# Patient Record
Sex: Male | Born: 1985 | Race: Black or African American | Hispanic: No | Marital: Married | Smoking: Current every day smoker
Health system: Southern US, Community
[De-identification: ages and names within clinical notes are randomized; demographics above are authoritative.]

## PROBLEM LIST (undated history)

## (undated) DIAGNOSIS — M549 Dorsalgia, unspecified: Secondary | ICD-10-CM

## (undated) HISTORY — PX: OTHER SURGICAL HISTORY: SHX169

## (undated) HISTORY — DX: Dorsalgia, unspecified: M54.9

---
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
## 2010-09-18 ENCOUNTER — Emergency Department (HOSPITAL_COMMUNITY)
Admission: EM | Admit: 2010-09-18 | Discharge: 2010-09-18 | Payer: Self-pay | Source: Home / Self Care | Admitting: Family Medicine

## 2010-12-03 LAB — GC/CHLAMYDIA PROBE AMP, GENITAL: Chlamydia, DNA Probe: NEGATIVE

## 2013-07-07 ENCOUNTER — Emergency Department (HOSPITAL_COMMUNITY)
Admission: EM | Admit: 2013-07-07 | Discharge: 2013-07-07 | Disposition: A | Discharge status: Home / Self Care | Payer: Self-pay | Attending: Emergency Medicine | Admitting: Emergency Medicine

## 2013-07-07 ENCOUNTER — Encounter (HOSPITAL_COMMUNITY): Payer: Self-pay | Admitting: Emergency Medicine

## 2013-07-07 DIAGNOSIS — K044 Acute apical periodontitis of pulpal origin: Secondary | ICD-10-CM | POA: Insufficient documentation

## 2013-07-07 DIAGNOSIS — R509 Fever, unspecified: Secondary | ICD-10-CM | POA: Insufficient documentation

## 2013-07-07 DIAGNOSIS — K047 Periapical abscess without sinus: Secondary | ICD-10-CM

## 2013-07-07 DIAGNOSIS — F17200 Nicotine dependence, unspecified, uncomplicated: Secondary | ICD-10-CM | POA: Insufficient documentation

## 2013-07-07 DIAGNOSIS — R51 Headache: Secondary | ICD-10-CM | POA: Insufficient documentation

## 2013-07-07 DIAGNOSIS — K0889 Other specified disorders of teeth and supporting structures: Secondary | ICD-10-CM

## 2013-07-07 MED ORDER — AMOXICILLIN 500 MG PO CAPS: 500.0000 mg | ORAL_CAPSULE | Freq: Three times a day (TID) | ORAL | Status: DC

## 2013-07-07 MED ORDER — HYDROCODONE-ACETAMINOPHEN 5-325 MG PO TABS: 1.0000 | ORAL_TABLET | ORAL | Status: DC | PRN

## 2013-07-07 NOTE — ED Notes (Signed)
Pt reports gradual onset on Left upper and lower wisdom teeth pain x3 days

## 2013-07-07 NOTE — ED Provider Notes (Signed)
CSN: [id number]     Arrival date & time 07/07/13  1143 History  This chart was scribed for Johnnette Gourd, PA, working with Donnetta Hutching, MD by Blanchard Kelch, ED Scribe. This patient was seen in room TR05C/TR05C and the patient's care was started at 12:01 PM.    Chief Complaint  Patient presents with  . Dental Pain    The history is provided by the patient. No language interpreter was used.    HPI Comments: Anthony Peck is a 27 y.o. male who presents to the Emergency Department complaining of upper and lower left dental pain that started three days ago. The pain radiates throughout the left side of his head. It is worsened by chewing. He describes the pain as a 9/10 in severity. He took ibuprofen for the pain without relief. He also complains of headache, fever and chills from the pain.   History reviewed. No pertinent past medical history. History reviewed. No pertinent past surgical history. History reviewed. No pertinent family history. History  Substance Use Topics  . Smoking status: Current Every Day Smoker  . Smokeless tobacco: Not on file  . Alcohol Use: Yes    Review of Systems A complete 10 system review of systems was obtained and all systems are negative except as noted in the HPI and PMH.    Allergies  Review of patient's allergies indicates no known allergies.  Home Medications  No current outpatient prescriptions on file.  Triage Vitals: BP 134/79  Pulse 75  Temp(Src) 98.7 F (37.1 C) (Oral)  Resp 16  Wt 184 lb 14.4 oz (83.87 kg)  SpO2 100%  Physical Exam  Nursing note and vitals reviewed. Constitutional: He is oriented to person, place, and time. He appears well-developed and well-nourished. No distress.  HENT:  Head: Normocephalic and atraumatic.  Mouth/Throat: Uvula is midline and oropharynx is clear and moist.    Left upper posterior molar with surrounding erythema and edema, no abscess.  Eyes: Conjunctivae and EOM are normal.  Neck: Normal range  of motion. Neck supple.  Cardiovascular: Normal rate, regular rhythm and normal heart sounds.   Pulmonary/Chest: Effort normal and breath sounds normal.  Musculoskeletal: Normal range of motion. He exhibits no edema.  Lymphadenopathy:       Head (left side): No submandibular adenopathy present.  Neurological: He is alert and oriented to person, place, and time.  Skin: Skin is warm and dry.  Psychiatric: He has a normal mood and affect. His behavior is normal.    ED Course  Procedures (including critical care time)  DIAGNOSTIC STUDIES: Oxygen Saturation is 100% on room air, normal by my interpretation.    COORDINATION OF CARE: 12:08 PM -Will order antibiotics and pain medication. Recommend follow up with dentist for infection. Patient verbalizes understanding and agrees with treatment plan.   Labs Review Labs Reviewed - No data to display Imaging Review No results found.  EKG Interpretation   None       MDM   1. Pain, dental   2. Dental infection     Dental pain associated with dental infection. No evidence of dental abscess. Patient is afebrile, non toxic appearing and swallowing secretions well. I gave patient referral to dentist and stressed the importance of dental follow up for ultimate management of dental pain. I will also give amoxicillin and pain control. Patient voices understanding and is agreeable to plan.   I personally performed the services described in this documentation, which was scribed in my presence. The  recorded information has been reviewed and is accurate.    Trevor Mace, PA-C 07/07/13 1218

## 2013-07-10 NOTE — ED Provider Notes (Signed)
Medical screening examination/treatment/procedure(s) were performed by non-physician practitioner and as supervising physician I was immediately available for consultation/collaboration.  Jordin Vicencio, MD 07/10/13 1550 

## 2013-08-13 ENCOUNTER — Emergency Department (HOSPITAL_COMMUNITY)
Admission: EM | Admit: 2013-08-13 | Discharge: 2013-08-13 | Disposition: A | Discharge status: Home / Self Care | Payer: Self-pay | Attending: Emergency Medicine | Admitting: Emergency Medicine

## 2013-08-13 ENCOUNTER — Emergency Department (HOSPITAL_COMMUNITY): Payer: Self-pay

## 2013-08-13 ENCOUNTER — Encounter (HOSPITAL_COMMUNITY): Payer: Self-pay | Admitting: Emergency Medicine

## 2013-08-13 DIAGNOSIS — Z23 Encounter for immunization: Secondary | ICD-10-CM | POA: Insufficient documentation

## 2013-08-13 DIAGNOSIS — S60221A Contusion of right hand, initial encounter: Secondary | ICD-10-CM

## 2013-08-13 DIAGNOSIS — W208XXA Other cause of strike by thrown, projected or falling object, initial encounter: Secondary | ICD-10-CM | POA: Insufficient documentation

## 2013-08-13 DIAGNOSIS — Y9389 Activity, other specified: Secondary | ICD-10-CM | POA: Insufficient documentation

## 2013-08-13 DIAGNOSIS — S6991XA Unspecified injury of right wrist, hand and finger(s), initial encounter: Secondary | ICD-10-CM

## 2013-08-13 DIAGNOSIS — S6990XA Unspecified injury of unspecified wrist, hand and finger(s), initial encounter: Secondary | ICD-10-CM | POA: Insufficient documentation

## 2013-08-13 DIAGNOSIS — S60229A Contusion of unspecified hand, initial encounter: Secondary | ICD-10-CM | POA: Insufficient documentation

## 2013-08-13 DIAGNOSIS — F17200 Nicotine dependence, unspecified, uncomplicated: Secondary | ICD-10-CM | POA: Insufficient documentation

## 2013-08-13 DIAGNOSIS — Y929 Unspecified place or not applicable: Secondary | ICD-10-CM | POA: Insufficient documentation

## 2013-08-13 MED ORDER — HYDROCODONE-ACETAMINOPHEN 5-325 MG PO TABS
2.0000 | ORAL_TABLET | Freq: Once | ORAL | Status: AC
  Administered 2013-08-13: 2 via ORAL
  Filled 2013-08-13: qty 2

## 2013-08-13 MED ORDER — TETANUS-DIPHTH-ACELL PERTUSSIS 5-2.5-18.5 LF-MCG/0.5 IM SUSP
0.5000 mL | Freq: Once | INTRAMUSCULAR | Status: AC
  Administered 2013-08-13: 0.5 mL via INTRAMUSCULAR
  Filled 2013-08-13: qty 0.5

## 2013-08-13 MED ORDER — IBUPROFEN 200 MG PO TABS
600.0000 mg | ORAL_TABLET | Freq: Once | ORAL | Status: AC
  Administered 2013-08-13: 600 mg via ORAL
  Filled 2013-08-13: qty 3

## 2013-08-13 NOTE — ED Provider Notes (Signed)
CSN: [id number]     Arrival date & time 08/13/13  1418 History  This chart was scribed for non-physician practitioner, Maryann Conners, working with Bonnita Levan. Bernette Mayers, MD by Shari Heritage, ED Scribe. This patient was seen in room TR08C/TR08C and the patient's care was started at 3:50 PM.    Chief Complaint  Patient presents with  . Hand Injury    The history is provided by the patient. No language interpreter was used.    HPI Comments: Anthony Peck is a 27 y.o. male who presents to the Emergency Department complaining of constant right hand pain onset several hours ago. Patient states that he was moving furniture when a dresser was dropped onto his right hand. Currently he rates pain as 10/10. There is associated swelling and abrasions to the right hand. He denies any other injuries at this time. He has no chronic medical conditions. He is a current every day smoker.   History reviewed. No pertinent past medical history. History reviewed. No pertinent past surgical history. No family history on file. History  Substance Use Topics  . Smoking status: Current Every Day Smoker -- 0.50 packs/day    Types: Cigarettes  . Smokeless tobacco: Not on file  . Alcohol Use: Yes    Review of Systems A complete 10 system review of systems was obtained and all systems are negative except as noted in the HPI and PMH.   Allergies  Review of patient's allergies indicates no known allergies.  Home Medications  No current outpatient prescriptions on file. Triage Vitals: BP 143/88  Pulse 103  Temp(Src) 98.9 F (37.2 C) (Oral)  Resp 16  Ht 6\' 3"  (1.905 m)  Wt 186 lb (84.369 kg)  BMI 23.25 kg/m2  SpO2 99% Physical Exam  Nursing note and vitals reviewed. Constitutional: He is oriented to person, place, and time. He appears well-developed and well-nourished. No distress.  HENT:  Head: Normocephalic and atraumatic.  Eyes: EOM are normal.  Neck: Neck supple.  Cardiovascular: Normal rate.    Musculoskeletal: Normal range of motion.  Tender to palpation over the 1st and 2nd MCPs with swelling. Range of motion of right hand limited by pain. Normal right wrist. 2+ radial pulses. Capillary refill less than 3 seconds.   Neurological: He is alert and oriented to person, place, and time.  Skin: Skin is warm and dry.  Over 1st and 2nd MCP joints there are three 1 mm superficial puncture wounds. No active bleeding.  Psychiatric: He has a normal mood and affect. His behavior is normal.    ED Course  Procedures (including critical care time) DIAGNOSTIC STUDIES: Oxygen Saturation is 99% on room air, normal by my interpretation.    COORDINATION OF CARE: 3:55 PM- Patient informed of current plan for treatment and evaluation and agrees with plan at this time.    Imaging Review Dg Hand Complete Right  08/13/2013   CLINICAL DATA:  Reported history of trauma with limited range of motion and pain  EXAM: RIGHT HAND - COMPLETE 3+ VIEW  COMPARISON:  None.  FINDINGS: There is no evidence of fracture or dislocation. There is no evidence of arthropathy or other focal bone abnormality. Soft tissues are unremarkable.  IMPRESSION: Negative.   Electronically Signed   By: Salome Holmes M.D.   On: 08/13/2013 15:06     MDM   1. Hand injury, right, initial encounter   2. Hand contusion, right, initial encounter    No acute findings on xray. Tdap  given. ACE wrap, ice, elevation, NSAIDs. Return precautions given. Neurovascularly intact. Patient states understanding of treatment care plan and is agreeable.   I personally performed the services described in this documentation, which was scribed in my presence. The recorded information has been reviewed and is accurate.   Trevor Mace, PA-C 08/13/13 [phone]

## 2013-08-13 NOTE — ED Notes (Signed)
Pt was moving furniture and a dresser dropped on his R hand.  R hand with abrasions and swollen.  C/o some numbness to 3rd and 4th fingers.

## 2013-08-13 NOTE — ED Notes (Signed)
PT ambulated with baseline gait; VSS; A&Ox3; no signs of distress; respirations even and unlabored; skin warm and dry; no questions upon discharge.  

## 2013-08-13 NOTE — ED Provider Notes (Signed)
Medical screening examination/treatment/procedure(s) were performed by non-physician practitioner and as supervising physician I was immediately available for consultation/collaboration.  EKG Interpretation   None         Jozey Janco B. Levii Hairfield, MD 08/13/13 1922 

## 2014-12-03 ENCOUNTER — Emergency Department (HOSPITAL_COMMUNITY)
Admission: EM | Admit: 2014-12-03 | Discharge: 2014-12-03 | Discharge status: Against Medical Advice | Payer: Self-pay | Attending: Emergency Medicine | Admitting: Emergency Medicine

## 2014-12-03 ENCOUNTER — Encounter (HOSPITAL_COMMUNITY): Payer: Self-pay | Admitting: Emergency Medicine

## 2014-12-03 DIAGNOSIS — Z720 Tobacco use: Secondary | ICD-10-CM | POA: Insufficient documentation

## 2014-12-03 DIAGNOSIS — Y998 Other external cause status: Secondary | ICD-10-CM | POA: Insufficient documentation

## 2014-12-03 DIAGNOSIS — S0990XA Unspecified injury of head, initial encounter: Secondary | ICD-10-CM | POA: Insufficient documentation

## 2014-12-03 DIAGNOSIS — W51XXXA Accidental striking against or bumped into by another person, initial encounter: Secondary | ICD-10-CM | POA: Insufficient documentation

## 2014-12-03 DIAGNOSIS — Y9289 Other specified places as the place of occurrence of the external cause: Secondary | ICD-10-CM | POA: Insufficient documentation

## 2014-12-03 DIAGNOSIS — Y9389 Activity, other specified: Secondary | ICD-10-CM | POA: Insufficient documentation

## 2014-12-03 NOTE — ED Notes (Signed)
No answer in WR x1

## 2014-12-03 NOTE — ED Notes (Signed)
Pt not present at this time. Will Remove from system

## 2014-12-03 NOTE — ED Notes (Signed)
Hematoma on L forehead, small abrasion noted to center.
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
## 2014-12-07 ENCOUNTER — Encounter (HOSPITAL_COMMUNITY): Payer: Self-pay | Admitting: *Deleted

## 2014-12-07 ENCOUNTER — Emergency Department (HOSPITAL_COMMUNITY)
Admission: EM | Admit: 2014-12-07 | Discharge: 2014-12-07 | Disposition: A | Discharge status: Home / Self Care | Payer: Self-pay | Attending: Emergency Medicine | Admitting: Emergency Medicine

## 2014-12-07 ENCOUNTER — Emergency Department (HOSPITAL_COMMUNITY): Payer: Self-pay

## 2014-12-07 DIAGNOSIS — Y9389 Activity, other specified: Secondary | ICD-10-CM | POA: Insufficient documentation

## 2014-12-07 DIAGNOSIS — Y9289 Other specified places as the place of occurrence of the external cause: Secondary | ICD-10-CM | POA: Insufficient documentation

## 2014-12-07 DIAGNOSIS — S0990XA Unspecified injury of head, initial encounter: Secondary | ICD-10-CM

## 2014-12-07 DIAGNOSIS — Y288XXA Contact with other sharp object, undetermined intent, initial encounter: Secondary | ICD-10-CM | POA: Insufficient documentation

## 2014-12-07 DIAGNOSIS — S0181XA Laceration without foreign body of other part of head, initial encounter: Secondary | ICD-10-CM | POA: Insufficient documentation

## 2014-12-07 DIAGNOSIS — Z720 Tobacco use: Secondary | ICD-10-CM | POA: Insufficient documentation

## 2014-12-07 DIAGNOSIS — Y998 Other external cause status: Secondary | ICD-10-CM | POA: Insufficient documentation
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
## 2014-12-07 NOTE — Discharge Instructions (Signed)
Take tylenol, motrin for pain.   Follow up with your doctor.   Return to ER if you have severe headache, vomiting, double vision.
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
## 2017-02-26 ENCOUNTER — Emergency Department (HOSPITAL_COMMUNITY)
Admission: EM | Admit: 2017-02-26 | Discharge: 2017-02-27 | Disposition: A | Discharge status: Home / Self Care | Payer: 59 | Attending: Emergency Medicine | Admitting: Emergency Medicine

## 2017-02-26 ENCOUNTER — Emergency Department (HOSPITAL_COMMUNITY): Payer: 59

## 2017-02-26 ENCOUNTER — Encounter (HOSPITAL_COMMUNITY): Payer: Self-pay | Admitting: Nurse Practitioner

## 2017-02-26 DIAGNOSIS — R29898 Other symptoms and signs involving the musculoskeletal system: Secondary | ICD-10-CM

## 2017-02-26 DIAGNOSIS — M549 Dorsalgia, unspecified: Secondary | ICD-10-CM | POA: Diagnosis present

## 2017-02-26 DIAGNOSIS — M546 Pain in thoracic spine: Secondary | ICD-10-CM

## 2017-02-26 DIAGNOSIS — F17210 Nicotine dependence, cigarettes, uncomplicated: Secondary | ICD-10-CM | POA: Insufficient documentation

## 2017-02-26 DIAGNOSIS — R202 Paresthesia of skin: Secondary | ICD-10-CM

## 2017-02-26 DIAGNOSIS — M62830 Muscle spasm of back: Secondary | ICD-10-CM | POA: Diagnosis not present

## 2017-02-26 DIAGNOSIS — R200 Anesthesia of skin: Secondary | ICD-10-CM

## 2017-02-26 LAB — COMPREHENSIVE METABOLIC PANEL
ALBUMIN: 4.1 g/dL (ref 3.5–5.0)
ALK PHOS: 43 U/L (ref 38–126)
ALT: 34 U/L (ref 17–63)
ANION GAP: 9 (ref 5–15)
AST: 26 U/L (ref 15–41)
BUN: 19 mg/dL (ref 6–20)
CALCIUM: 9.1 mg/dL (ref 8.9–10.3)
CO2: 31 mmol/L (ref 22–32)
Chloride: 101 mmol/L (ref 101–111)
Creatinine, Ser: 1.28 mg/dL — ABNORMAL HIGH (ref 0.61–1.24)
GFR calc Af Amer: >60 mL/min (ref >60)
Glucose, Bld: 97 mg/dL (ref 65–99)
POTASSIUM: 4.7 mmol/L (ref 3.5–5.1)
SODIUM: 141 mmol/L (ref 135–145)
TOTAL PROTEIN: 6.2 g/dL — AB (ref 6.5–8.1)
Total Bilirubin: 1.2 mg/dL (ref 0.3–1.2)

## 2017-02-26 LAB — CBC WITH DIFFERENTIAL/PLATELET
BASOS ABS: 0 10*3/uL (ref 0.0–0.1)
Basophils Relative: 0 %
EOS ABS: 0.1 10*3/uL (ref 0.0–0.7)
EOS PCT: 1 %
HCT: 45.8 % (ref 39.0–52.0)
HEMOGLOBIN: 15.3 g/dL (ref 13.0–17.0)
Lymphocytes Relative: 51 %
Lymphs Abs: 5.4 10*3/uL — ABNORMAL HIGH (ref 0.7–4.0)
MCH: 29.7 pg (ref 26.0–34.0)
MCHC: 33.4 g/dL (ref 30.0–36.0)
MCV: 88.9 fL (ref 78.0–100.0)
Monocytes Absolute: 0.7 10*3/uL (ref 0.1–1.0)
Monocytes Relative: 7 %
NEUTROS PCT: 41 %
Neutro Abs: 4.2 10*3/uL (ref 1.7–7.7)
PLATELETS: 262 10*3/uL (ref 150–400)
RBC: 5.15 MIL/uL (ref 4.22–5.81)
RDW: 12.8 % (ref 11.5–15.5)
WBC: 10.5 10*3/uL (ref 4.0–10.5)

## 2017-02-26 NOTE — ED Notes (Signed)
Provider at the bedside.  

## 2017-02-26 NOTE — ED Provider Notes (Signed)
[facility]-EMERGENCY DEPT Provider Note   CSN: [id number] Arrival date & time: 02/26/17  1702  By signing my name below, I, Rosana Fret, attest that this documentation has been prepared under the direction and in the presence of non-physician practitioner, Kerrie Buffalo, FNP. Electronically Signed: Rosana Fret, ED Scribe. 02/26/17. 6:56 PM.  History   Chief Complaint Chief Complaint  Patient presents with  . Back Pain   The history is provided by the patient. No language interpreter was used.   HPI Comments: Anthony Peck is a 31 y.o. male who presents to the Emergency Department complaining of constant, moderate back pain onset 1 week ago. Pt describes pain as radiating from his neck to his lower back. Pt has a hx of a back injury. 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 Pt regularly works out and reports that he lifted 275 lbs 2 weeks ago.  Pt reports associated constipation and bloated feeling. Pt states his neck pain is exacerbated by looking upward and his back pain is exacerbated by twisting. Pt has tried Tramadol, cyclobenzaprine, and dexamethasone with minimal relief of his symptoms. Pt denies weakness, urinary frequency, dysuria or any other complaints at this time.  History reviewed. No pertinent past medical history.  There are no active problems to display for this patient.   History reviewed. No pertinent surgical history.     Home Medications    Prior to Admission medications   Not on File    Family History History reviewed. No pertinent family history.  Social History Social History  Substance Use Topics  . Smoking status: Current Every Day Smoker    Packs/day: 0.50    Types: Cigarettes  . Smokeless tobacco: Never Used  . Alcohol use Yes     Comment: weekly     Allergies   Patient has no known allergies.   Review of Systems Review of Systems  Gastrointestinal: Positive for constipation.  Genitourinary:  Negative for dysuria and frequency.  Musculoskeletal: Positive for back pain, myalgias and neck pain.  Neurological: Positive for numbness. Negative for weakness.     Physical Exam Updated Vital Signs BP (!) 143/82 (BP Location: Left Arm)   Pulse 78   Temp 98.9 F (37.2 C) (Oral)   Resp (!) 22   SpO2 99%   Physical Exam  Constitutional: He appears well-developed and well-nourished. No distress.  HENT:  Head: Normocephalic and atraumatic.  Eyes: EOM are normal.  Neck: Normal range of motion. Neck supple.  Cardiovascular: Normal rate and regular rhythm.   Pulmonary/Chest: Effort normal and breath sounds normal.  Abdominal: Soft. Bowel sounds are normal. There is tenderness (mild epigastric). There is no rebound and no guarding.  Musculoskeletal: He exhibits tenderness. He exhibits no edema.       Cervical back: He exhibits tenderness and pain. He exhibits normal range of motion, no deformity and no spasm.       Thoracic back: He exhibits tenderness, pain and spasm. He exhibits normal range of motion and no deformity.  Tenderness over the cervical spine and thoracic spine. Muscle spasm of the thoracic area bilaterally.   Neurological: He is alert. He has normal strength. Gait normal.  Reflex Scores:      Bicep reflexes are 1+ on the right side and 1+ on the left side.      Brachioradialis reflexes are 1+ on the right side and 1+ on the left side.      Patellar  reflexes are 1+ on the right side and 1+ on the left side. Skin: Skin is warm and dry.  Psychiatric: He has a normal mood and affect. His behavior is normal.  Nursing note and vitals reviewed.    ED Treatments / Results  DIAGNOSTIC STUDIES: Oxygen Saturation is 99% on RA, normal by my interpretation.   COORDINATION OF CARE: 6:51 PM-Discussed next steps with pt including imaging of the back and neck. Pt verbalized understanding and is agreeable with the plan.   Labs (all labs ordered are listed, but only abnormal  results are displayed) Labs Reviewed - No data to display   Radiology Ct Cervical Spine Wo Contrast  Result Date: 02/26/2017 CLINICAL DATA:  Neck pain for 1 week. EXAM: CT CERVICAL SPINE WITHOUT CONTRAST TECHNIQUE: Multidetector CT imaging of the cervical spine was performed without intravenous contrast. Multiplanar CT image reconstructions were also generated. COMPARISON:  None. FINDINGS: Alignment: Normal. Skull base and vertebrae: No acute fracture. No primary bone lesion or focal pathologic process. Soft tissues and spinal canal: No prevertebral fluid or swelling. No visible canal hematoma. Disc levels:  Normal. Upper chest: Negative. Other: None. IMPRESSION: Normal cervical spine. Electronically Signed   By: Lupita RaiderJames  Green Jr, M.D.   On: 02/26/2017 19:38    Procedures Procedures (including critical care time)  Medications Ordered in ED Medications - No data to display   7:45 pm Returned to the exam room to discuss with the patient results of CT and he reports that about an hour ago he began feeling numbness in his feet and it has now gone up to his knees. He reports that he has a sensation in his upper abdomen that feels like his food is stuck there.   Discussed with Dr. Dalene SeltzerSchlossman and [facility]. Report given to Sharen HonesGail Schultz, NP  Initial Impression / Assessment and Plan / ED Course  I have reviewed the triage vital signs and the nursing notes.  New Prescriptions New Prescriptions   No medications on file  I personally performed the services described in this documentation, which was scribed in my presence. The recorded information has been reviewed and is accurate.    Kerrie Buffaloeese, Archimedes Harold BataviaM, [place]NP 02/26/17 2034    Alvira MondaySchlossman, Erin, MD 02/28/17 1535

## 2017-02-26 NOTE — ED Notes (Signed)
Pt taken to MRI  

## 2017-02-26 NOTE — ED Provider Notes (Signed)
This a 31 year old male who presents with one week of back pain that starts in cervical spine and radiates to LS-spine.  Has a remote history of a back injury as teen and was told that it caused irritation of the fluid in his back.  He's had no residual problems since that time.  Denies any recent illnesses.  He was treated with a course of steroids which he has completed presents to the emergency department tonight because of worsening pain,  numbness and tingling to his lower extremities bilaterally started in the feet that now extends to just below the knee.  He is having trouble sitting up without pulling himself into a sitting position with his arms.  He also states that he has some epigastric midabdominal discomfort, nice any nausea or vomiting, no diarrhea, constipation.  Denies any shortness of breath.  Patient CT of the cervical spine is normal.  MRI of thoracic and LS-spine normal.  Recommend patient stop taking the steroids.  I will give him Robaxin as a muscle relaxer to take the next week.  He is to follow-up with neurology as well as monitor his symptoms.  He's been instructed to return if the numbness and tingling extending up into the thighs or he develops new symptoms.   Earley FavorSchulz, Latera Mclin, NP 02/27/17 0100    Alvira MondaySchlossman, Erin, MD 02/28/17 [phone]

## 2017-02-26 NOTE — ED Notes (Signed)
IV attempted without success. 

## 2017-02-26 NOTE — ED Notes (Signed)
Pt taken to CT.

## 2017-02-26 NOTE — ED Triage Notes (Signed)
Pt presents with c/o upper back pain and neck pain. The pain began last week. 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 He was seen at another facility last week and given tramadol, cyclobenzaprine, dexamethasone for this complaint with no improvement in the pain and he feels the medications are causing negative side effects for him.

## 2017-02-27 LAB — URINALYSIS, ROUTINE W REFLEX MICROSCOPIC
Bilirubin Urine: NEGATIVE
GLUCOSE, UA: NEGATIVE mg/dL
HGB URINE DIPSTICK: NEGATIVE
KETONES UR: NEGATIVE mg/dL
Leukocytes, UA: NEGATIVE
Nitrite: NEGATIVE
PH: 5 (ref 5.0–8.0)
PROTEIN: NEGATIVE mg/dL
Specific Gravity, Urine: 1.03 (ref 1.005–1.030)

## 2017-02-27 MED ORDER — METHOCARBAMOL 750 MG PO TABS: 750.0000 mg | ORAL_TABLET | Freq: Two times a day (BID) | ORAL | 0 refills | Status: AC

## 2017-02-27 MED ORDER — METHOCARBAMOL 500 MG PO TABS
750.0000 mg | ORAL_TABLET | Freq: Once | ORAL | Status: AC
  Administered 2017-02-27: 750 mg via ORAL
  Filled 2017-02-27: qty 2

## 2017-02-27 NOTE — Discharge Instructions (Signed)
As discussed, I want to to carefully monitor the sensation that you're having in your lower legs have become worse.  Sputum into thighs allergy to return to the emergency for further evaluation. Stop taking the prednisone You've been given a prescription for Robaxin, which is a muscle relaxer.  Please take this as directed Please call and make an appointment with [facility] for further evaluation of your symptoms

## 2017-04-07 ENCOUNTER — Encounter: Payer: Self-pay | Admitting: Neurology

## 2017-04-07 ENCOUNTER — Ambulatory Visit (INDEPENDENT_AMBULATORY_CARE_PROVIDER_SITE_OTHER): Payer: 59 | Admitting: Neurology

## 2017-04-07 VITALS — BP 132/76 | HR 75 | Ht 74.0 in | Wt 206.0 lb

## 2017-04-07 DIAGNOSIS — M7918 Myalgia, other site: Secondary | ICD-10-CM

## 2017-04-07 DIAGNOSIS — M791 Myalgia, unspecified site: Secondary | ICD-10-CM

## 2017-04-07 DIAGNOSIS — M549 Dorsalgia, unspecified: Secondary | ICD-10-CM

## 2017-04-07 MED ORDER — DICLOFENAC SODIUM 1 % TD GEL: 4.0000 g | Freq: Four times a day (QID) | TRANSDERMAL | 11 refills | Status: DC

## 2017-04-07 MED ORDER — IBUPROFEN 800 MG PO TABS: 800.0000 mg | ORAL_TABLET | Freq: Three times a day (TID) | ORAL | 0 refills | Status: DC | PRN

## 2017-04-07 MED ORDER — TIZANIDINE HCL 4 MG PO TABS: 4.0000 mg | ORAL_TABLET | Freq: Every day | ORAL | 3 refills | Status: DC

## 2017-04-07 NOTE — Progress Notes (Signed)
[facility]    Provider:  Dr Lucia Gaskins Referring Provider: Emergency room Primary Care Physician:  Patient, No Pcp Per  CC:  Back pain  HPI:  Anthony Peck is a 31 y.o. male here as a referral from the emergency room for back pain. Symptoms started at the end of May. He has PMHx of remote back injury that was very similar but this was worse. He reports burning pain around the cervical spine and all the way down his spine having pain, under his right shoulder blade felt muscle pain or out of place. Hard to explain. The symptoms started in the setting of working out and lifting heavy weights when laying on the bench. Worse with standing for long periods of time. Worse with lifting boxes at work. Massage made it better as did stretching. Medication given did not help. He did not tolerate Tramadol given to him at urgent care and then he went to the ED bc his legs were going numb. They gave him Robaxin at the ED which helped the numbness in his feet. He being more careful weight lifting. Still has residual pain but the burning sensation and the shoulder pain. He lifts up to 20 pounds at work. No changes in bowel or bladder. No weakness. No other focal neurologic deficits, associated symptoms, inciting events or modifiable factors.  Reviewed notes, labs and imaging from outside physicians, which showed:   Review of emergency room notes from 02/26/2017 and patient presented with upper back pain and neck pain. This happened in the setting of lifting at his job in routine weight lifting during workouts but did not recall any recent increased exertion or straining. He was seen in the previous week at another facility and given tramadol, cyclobenzaprine, dexamethasone with no improvement in the pain with side effects of the medication. The pain radiates from his neck to his lower back. He does have a history of back injury. He lifted 270 pounds several weeks ago. Neck pain is exacerbated by  looking upward and his back pain is exacerbated by twisting. He is a current every day smoker. While in the emergency room he began feeling numbness in his feet up to his knees. CT of the cervical spine was normal per notes, as were MRI of the thoracic and lumbar spine. He was given Robaxin as a muscle relaxer.  Personally reviewed images of CT cervical spine, MRI thoracic and lumbar spine which were all normal.  CMP with elevated creatinine 1.28, cbc unremarkable  Review of Systems: Patient complains of symptoms per HPI as well as the following symptoms: no CP, no SOB. Pertinent negatives and positives per HPI. All others negative.   Social History   Social History  . Marital status: Married    Spouse name: N/A  . Number of children: N/A  . Years of education: N/A   Occupational History  . Not on file.   Social History Main Topics  . Smoking status: Current Every Day Smoker    Packs/day: 0.50    Types: Cigarettes  . Smokeless tobacco: Never Used  . Alcohol use Yes     Comment: weekly  . Drug use: No  . Sexual activity: Not on file   Other Topics Concern  . Not on file   Social History Narrative  . No narrative on file    Family History  Problem Relation Age of Onset  . Lumbar disc disease Mother     Past Medical History:  Diagnosis Date  .  Back pain     Past Surgical History:  Procedure Laterality Date  . no previous surgeries      Current Outpatient Prescriptions  Medication Sig Dispense Refill  . diclofenac sodium (VOLTAREN) 1 % GEL Apply 4 g topically 4 (four) times daily. 100 g 11  . ibuprofen (ADVIL,MOTRIN) 800 MG tablet Take 1 tablet (800 mg total) by mouth every 8 (eight) hours as needed. 90 tablet 0  . tiZANidine (ZANAFLEX) 4 MG tablet Take 1 tablet (4 mg total) by mouth at bedtime. 30 tablet 3   No current facility-administered medications for this visit.     Allergies as of 04/07/2017  . (No Known Allergies)    Vitals: BP 132/76   Pulse  75   Ht 6\' 2"  (1.88 m)   Wt 206 lb (93.4 kg)   BMI 26.45 kg/m  Last Weight:  Wt Readings from Last 1 Encounters:  04/07/17 206 lb (93.4 kg)   Last Height:   Ht Readings from Last 1 Encounters:  04/07/17 6\' 2"  (1.88 m)    Physical exam: Exam: Gen: NAD, conversant, well nourised, obese, well groomed                     CV: RRR, no MRG. No Carotid Bruits. No peripheral edema, warm, nontender Eyes: Conjunctivae clear without exudates or hemorrhage  Neuro: Detailed Neurologic Exam  Speech:    Speech is normal; fluent and spontaneous with normal comprehension.  Cognition:    The patient is oriented to person, place, and time;     recent and remote memory intact;     language fluent;     normal attention, concentration,     fund of knowledge Cranial Nerves:    The pupils are equal, round, and reactive to light. The fundi are normal and spontaneous venous pulsations are present. Visual fields are full to finger confrontation. Extraocular movements are intact. Trigeminal sensation is intact and the muscles of mastication are normal. The face is symmetric. The palate elevates in the midline. Hearing intact. Voice is normal. Shoulder shrug is normal. The tongue has normal motion without fasciculations.   Coordination:    Normal finger to nose and heel to shin. Normal rapid alternating movements.   Gait:    Heel-toe and tandem gait are normal.   Motor Observation:    No asymmetry, no atrophy, and no involuntary movements noted. Tone:    Normal muscle tone.    Posture:    Posture is normal. normal erect    Strength:    Strength is V/V in the upper and lower limbs.      Sensation: intact to LT     Reflex Exam:  DTR's:    Deep tendon reflexes in the upper and lower extremities are normal bilaterally.   Toes:    The toes are downgoing bilaterally.   Clonus:    Clonus is absent.      Assessment/Plan:  31 year old with musculoskeletal back and neck pain. CT cervical,  MRI thoracic and lumbar spines in the ED were normal. He has been treated with steroids, flexeril, tramadol, Robaxin without relief.  - Physical therapy - Heat and massage - Topical Voltaren - Ibuprofen (short term) - Tizanidine at bedtime (short term) - For any significant changes or numbness, new weakness, bowel bladder problems go to ED immediately - Repeat bmp due to elevated creatinine but needs f/u with pcp, discussed  Naomie DeanAntonia Geo Slone, MD  [facility] [phone] [place], [place] [address]  Phone [phone] Fax [phone]

## 2017-04-07 NOTE — Patient Instructions (Signed)
Remember to drink plenty of fluid, eat healthy meals and do not skip any meals. Try to eat protein with a every meal and eat a healthy snack such as fruit or nuts in between meals. Try to keep a regular sleep-wake schedule and try to exercise daily, particularly in the form of walking, 20-30 minutes a day, if you can.   As far as your medications are concerned, I would like to suggest:  Tizanidine at bedtime Voltaren gel topical up to 4x a day Physical therapy Heat and massage Ibuprofen 3x a day for several weeks take with food  As far as diagnostic testing: Lab today  I would like to see you back as needed, sooner if we need to. Please call us with any interim questions, concerns, problems, updates or refill requests.   Our phone number is [phone]. We also have an after hours call service for urgent matters and there is a physician on-call for urgent questions. For any emergencies you know to call 911 or go to the nearest emergency room  Tizanidine tablets or capsules What is this medicine? TIZANIDINE (tye ZAN i deen) helps to relieve muscle spasms. It may be used to help in the treatment of multiple sclerosis and spinal cord injury. This medicine may be used for other purposes; ask your health care provider or pharmacist if you have questions. COMMON BRAND NAME(S): Zanaflex What should I tell my health care provider before I take this medicine? They need to know if you have any of these conditions: -kidney disease -liver disease -low blood pressure -mental disorder -an unusual or allergic reaction to tizanidine, other medicines, lactose (tablets only), foods, dyes, or preservatives -pregnant or trying to get pregnant -breast-feeding How should I use this medicine? Take this medicine by mouth with a full glass of water. Take this medicine on an empty stomach, at least 30 minutes before or 2 hours after food. Do not take with food unless you talk with your doctor. Follow the  directions on the prescription label. Take your medicine at regular intervals. Do not take your medicine more often than directed. Do not stop taking except on your doctor's advice. Suddenly stopping the medicine can be very dangerous. Talk to your pediatrician regarding the use of this medicine in children. Patients over 31 years old may have a stronger reaction and need a smaller dose. Overdosage: If you think you have taken too much of this medicine contact a poison control center or emergency room at once. NOTE: This medicine is only for you. Do not share this medicine with others. What if I miss a dose? If you miss a dose, take it as soon as you can. If it is almost time for your next dose, take only that dose. Do not take double or extra doses. What may interact with this medicine? Do not take this medicine with any of the following medications: -ciprofloxacin -cisapride -dofetilide -dronedarone -fluvoxamine -narcotic medicines for cough -pimozide -thiabendazole -thioridazine -ziprasidone This medicine may also interact with the following medications: -acyclovir -alcohol -antihistamines for allergy, cough and cold -baclofen -certain antibiotics like levofloxacin, ofloxacin -certain medicines for anxiety or sleep -certain medicines for blood pressure, heart disease, irregular heart beat -certain medicines for depression like amitriptyline, fluoxetine, sertraline -certain medicines for seizures like phenobarbital, primidone -certain medicines for stomach problems like cimetidine, famotidine -male hormones, like estrogens or progestins and birth control pills, patches, rings, or injections -general anesthetics like halothane, isoflurane, methoxyflurane, propofol -local anesthetics like lidocaine, pramoxine, tetracaine -medicines  that relax muscles for surgery -narcotic medicines for pain -other medicines that prolong the QT interval (cause an abnormal heart  rhythm) -phenothiazines like chlorpromazine, mesoridazine, prochlorperazine -ticlopidine -zileuton This list may not describe all possible interactions. Give your health care provider a list of all the medicines, herbs, non-prescription drugs, or dietary supplements you use. Also tell them if you smoke, drink alcohol, or use illegal drugs. Some items may interact with your medicine. What should I watch for while using this medicine? Tell your doctor or health care professional if your symptoms do not start to get better or if they get worse. You may get drowsy or dizzy. Do not drive, use machinery, or do anything that needs mental alertness until you know how this medicine affects you. Do not stand or sit up quickly, especially if you are an older patient. This reduces the risk of dizzy or fainting spells. Alcohol may interfere with the effect of this medicine. Avoid alcoholic drinks. If you are taking another medicine that also causes drowsiness, you may have more side effects. Give your health care provider a list of all medicines you use. Your doctor will tell you how much medicine to take. Do not take more medicine than directed. Call emergency for help if you have problems breathing or unusual sleepiness. Your mouth may get dry. Chewing sugarless gum or sucking hard candy, and drinking plenty of water may help. Contact your doctor if the problem does not go away or is severe. What side effects may I notice from receiving this medicine? Side effects that you should report to your doctor or health care professional as soon as possible: -allergic reactions like skin rash, itching or hives, swelling of the face, lips, or tongue -breathing problems -hallucinations -signs and symptoms of liver injury like dark yellow or brown urine; general ill feeling or flu-like symptoms; light-colored stools; loss of appetite; nausea; right upper quadrant belly pain; unusually weak or tired; yellowing of the eyes or  skin -signs and symptoms of low blood pressure like dizziness; feeling faint or lightheaded, falls; unusually weak or tired -unusually slow heartbeat -unusually weak or tired Side effects that usually do not require medical attention (report to your doctor or health care professional if they continue or are bothersome): -blurred vision -constipation -dizziness -dry mouth -tiredness This list may not describe all possible side effects. Call your doctor for medical advice about side effects. You may report side effects to FDA at 1-800-FDA-1088. Where should I keep my medicine? Keep out of the reach of children. Store at room temperature between 15 and 30 degrees C (59 and 86 degrees F). Throw away any unused medicine after the expiration date. NOTE: This sheet is a summary. It may not cover all possible information. If you have questions about this medicine, talk to your doctor, pharmacist, or health care provider.  2018 Elsevier/Gold Standard (2015-06-20 13:52:12)   Ibuprofen tablets and capsules What is this medicine? IBUPROFEN (eye BYOO proe fen) is a non-steroidal anti-inflammatory drug (NSAID). It is used for dental pain, fever, headaches or migraines, osteoarthritis, rheumatoid arthritis, or painful monthly periods. It can also relieve minor aches and pains caused by a cold, flu, or sore throat. This medicine may be used for other purposes; ask your health care provider or pharmacist if you have questions. COMMON BRAND NAME(S): Advil, Advil Junior Strength, Advil Migraine, Genpril, Ibren, IBU, Midol, Midol Cramps and Body Aches, Motrin, Motrin IB, Motrin Junior Strength, Motrin Migraine Pain, Samson-8, Toxicology Saliva Collection What should  I tell my health care provider before I take this medicine? They need to know if you have any of these conditions: -asthma -cigarette smoker -drink more than 3 alcohol containing drinks a day -heart disease or circulation problems such as heart  failure or leg edema (fluid retention) -high blood pressure -kidney disease -liver disease -stomach bleeding or ulcers -an unusual or allergic reaction to ibuprofen, aspirin, other NSAIDS, other medicines, foods, dyes, or preservatives -pregnant or trying to get pregnant -breast-feeding How should I use this medicine? Take this medicine by mouth with a glass of water. Follow the directions on the prescription label. Take this medicine with food if your stomach gets upset. Try to not lie down for at least 10 minutes after you take the medicine. Take your medicine at regular intervals. Do not take your medicine more often than directed. A special MedGuide will be given to you by the pharmacist with each prescription and refill. Be sure to read this information carefully each time. Talk to your pediatrician regarding the use of this medicine in children. Special care may be needed. Overdosage: If you think you have taken too much of this medicine contact a poison control center or emergency room at once. NOTE: This medicine is only for you. Do not share this medicine with others. What if I miss a dose? If you miss a dose, take it as soon as you can. If it is almost time for your next dose, take only that dose. Do not take double or extra doses. What may interact with this medicine? Do not take this medicine with any of the following medications: -cidofovir -ketorolac -methotrexate -pemetrexed This medicine may also interact with the following medications: -alcohol -aspirin -diuretics -lithium -other drugs for inflammation like prednisone -warfarin This list may not describe all possible interactions. Give your health care provider a list of all the medicines, herbs, non-prescription drugs, or dietary supplements you use. Also tell them if you smoke, drink alcohol, or use illegal drugs. Some items may interact with your medicine. What should I watch for while using this medicine? Tell your  doctor or healthcare professional if your symptoms do not start to get better or if they get worse. This medicine does not prevent heart attack or stroke. In fact, this medicine may increase the chance of a heart attack or stroke. The chance may increase with longer use of this medicine and in people who have heart disease. If you take aspirin to prevent heart attack or stroke, talk with your doctor or health care professional. Do not take other medicines that contain aspirin, ibuprofen, or naproxen with this medicine. Side effects such as stomach upset, nausea, or ulcers may be more likely to occur. Many medicines available without a prescription should not be taken with this medicine. This medicine can cause ulcers and bleeding in the stomach and intestines at any time during treatment. Ulcers and bleeding can happen without warning symptoms and can cause death. To reduce your risk, do not smoke cigarettes or drink alcohol while you are taking this medicine. You may get drowsy or dizzy. Do not drive, use machinery, or do anything that needs mental alertness until you know how this medicine affects you. Do not stand or sit up quickly, especially if you are an older patient. This reduces the risk of dizzy or fainting spells. This medicine can cause you to bleed more easily. Try to avoid damage to your teeth and gums when you brush or floss your teeth. This  medicine may be used to treat migraines. If you take migraine medicines for 10 or more days a month, your migraines may get worse. Keep a diary of headache days and medicine use. Contact your healthcare professional if your migraine attacks occur more frequently. What side effects may I notice from receiving this medicine? Side effects that you should report to your doctor or health care professional as soon as possible: -allergic reactions like skin rash, itching or hives, swelling of the face, lips, or tongue -severe stomach pain -signs and symptoms  of bleeding such as bloody or black, tarry stools; red or dark-brown urine; spitting up blood or brown material that looks like coffee grounds; red spots on the skin; unusual bruising or bleeding from the eye, gums, or nose -signs and symptoms of a blood clot such as changes in vision; chest pain; severe, sudden headache; trouble speaking; sudden numbness or weakness of the face, arm, or leg -unexplained weight gain or swelling -unusually weak or tired -yellowing of eyes or skin Side effects that usually do not require medical attention (report to your doctor or health care professional if they continue or are bothersome): -bruising -diarrhea -dizziness, drowsiness -headache -nausea, vomiting This list may not describe all possible side effects. Call your doctor for medical advice about side effects. You may report side effects to FDA at 1-800-FDA-1088. Where should I keep my medicine? Keep out of the reach of children. Store at room temperature between 15 and 30 degrees C (59 and 86 degrees F). Keep container tightly closed. Throw away any unused medicine after the expiration date. NOTE: This sheet is a summary. It may not cover all possible information. If you have questions about this medicine, talk to your doctor, pharmacist, or health care provider.  2018 Elsevier/Gold Standard (2013-05-11 10:48:02)

## 2017-04-08 ENCOUNTER — Telehealth: Payer: Self-pay | Admitting: Physical Therapy

## 2017-04-08 NOTE — Telephone Encounter (Signed)
04/08/17 due to $2500 deductible with 0 met, patient wants to wait to schedule PT

## 2017-04-09 LAB — BASIC METABOLIC PANEL
BUN/Creatinine Ratio: 12 (ref 9–20)
BUN: 14 mg/dL (ref 6–20)
CHLORIDE: 103 mmol/L (ref 96–106)
CO2: 23 mmol/L (ref 20–29)
CREATININE: 1.18 mg/dL (ref 0.76–1.27)
Calcium: 9.4 mg/dL (ref 8.7–10.2)
GFR calc Af Amer: 94 mL/min/{1.73_m2} (ref >59)
GFR calc non Af Amer: 82 mL/min/{1.73_m2} (ref >59)
GLUCOSE: 85 mg/dL (ref 65–99)
Potassium: 4.8 mmol/L (ref 3.5–5.2)
SODIUM: 144 mmol/L (ref 134–144)

## 2017-04-10 ENCOUNTER — Telehealth: Payer: Self-pay | Admitting: *Deleted

## 2017-04-10 NOTE — Telephone Encounter (Signed)
LVM with number informing patient his labs are normal. Phone staff may relay message if patient calls back.

## 2017-05-04 ENCOUNTER — Other Ambulatory Visit: Payer: Self-pay | Admitting: Neurology

## 2018-10-06 IMAGING — MR MR THORACIC SPINE W/O CM
5 of 13 series · 18 of 48 positions shown · non-contrast
Comparison: None.

CLINICAL DATA: Initial evaluation for acute mid back/thoracic spine
pain with ascending lower extremity weakness, numbness, and
tingling.

EXAM:
MRI THORACIC AND LUMBAR SPINE WITHOUT CONTRAST
TECHNIQUE: Multiplanar and multiecho pulse sequences of the thoracic and lumbar
spine were obtained without intravenous contrast.

[Series 2: T2 · sagittal · 4.0mm · 0.55mm/px · 1 of 13 slices shown (1 of 5)]
[im 1/13]
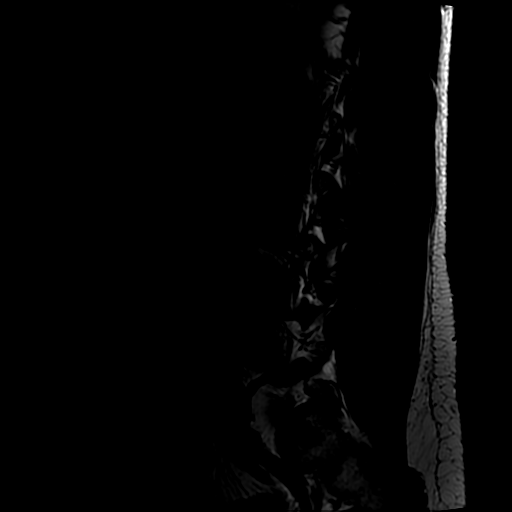

[Series 5: T2 · axial · 4.0mm · 0.39mm/px · z∈[-98,+94]mm · 6 of 37 slices shown (2 of 5)]
[im 1/37]
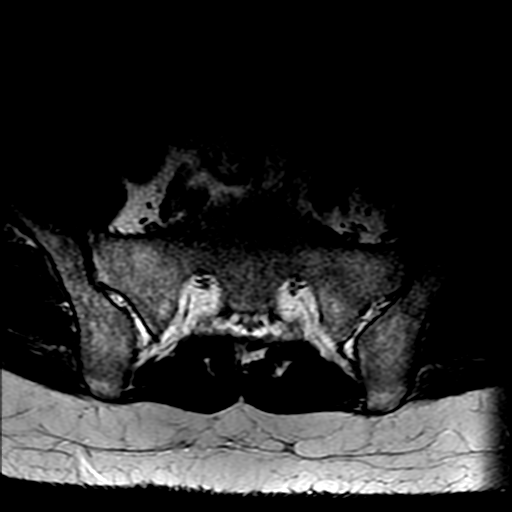
[im 8/37]
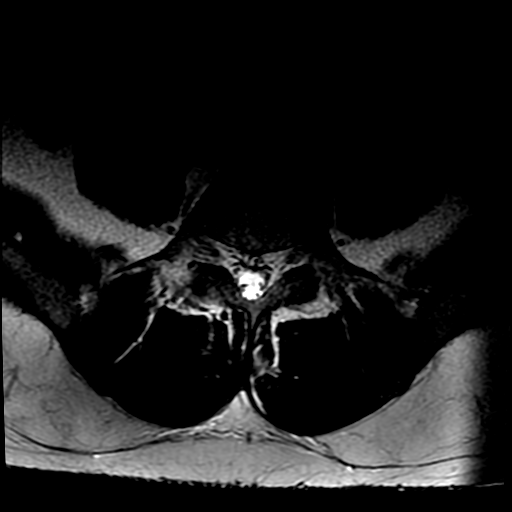
[im 15/37]
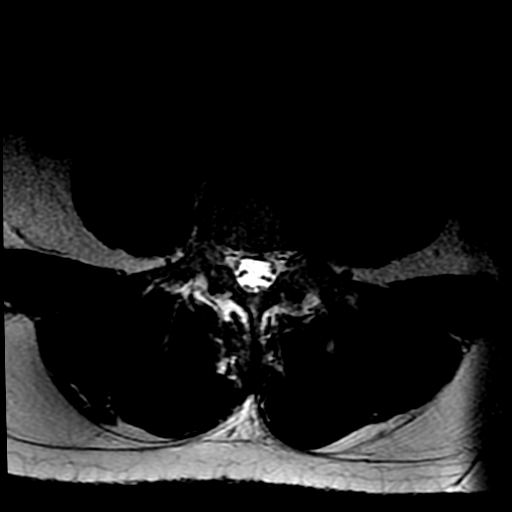
[im 22/37]
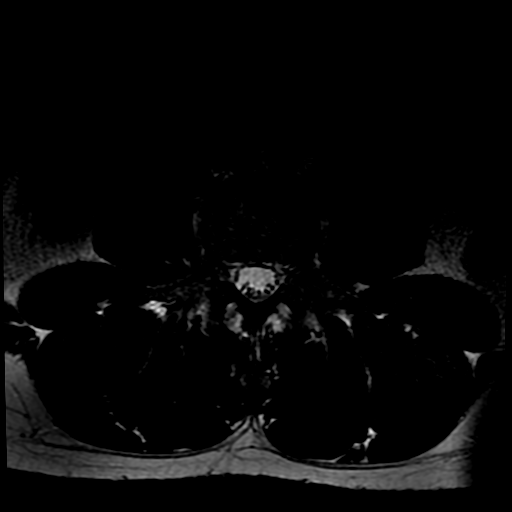
[im 29/37]
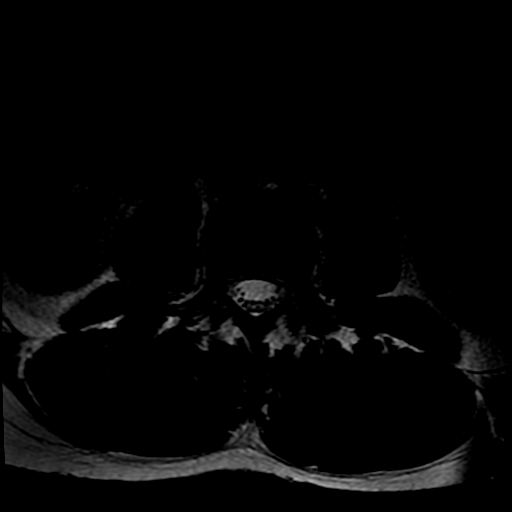
[im 37/37]
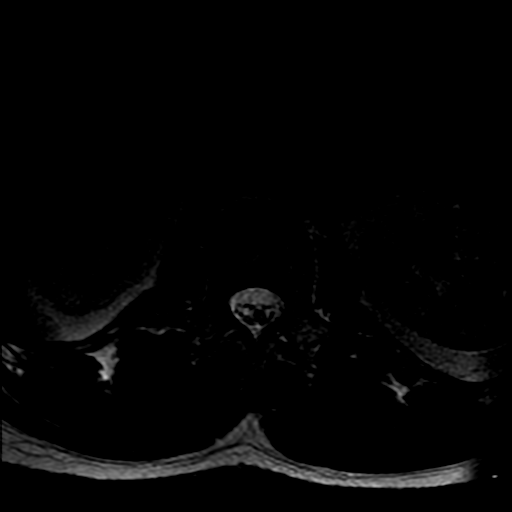

[Series 13: T2 · sagittal · 3.0mm · 0.66mm/px · 3 of 15 slices shown (3 of 5)]
[im 1/15]
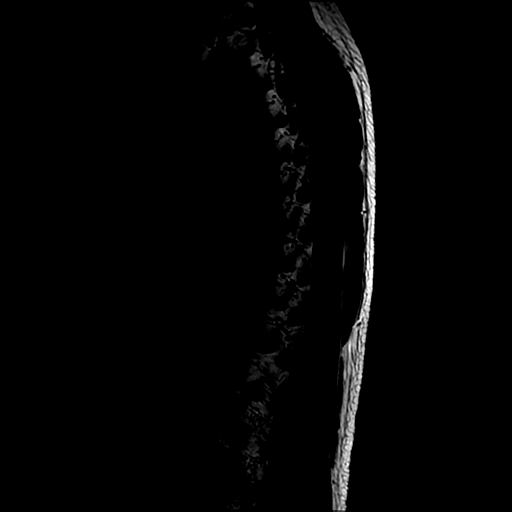
[im 8/15]
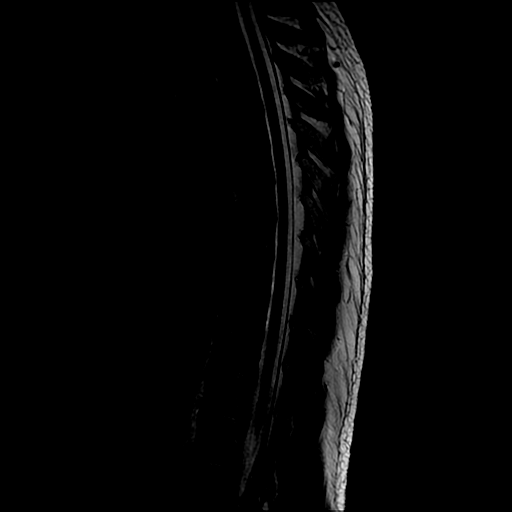
[im 15/15]
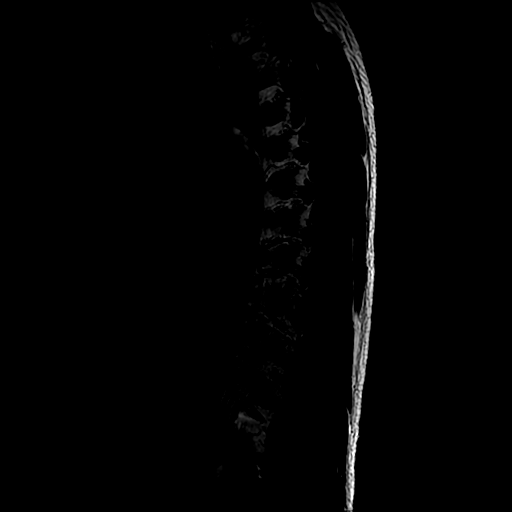

[Series 14: T2 · axial · 4.0mm · 0.39mm/px · z∈[-193,-13]mm · 6 of 34 slices shown (4 of 5)]
[im 1/34]
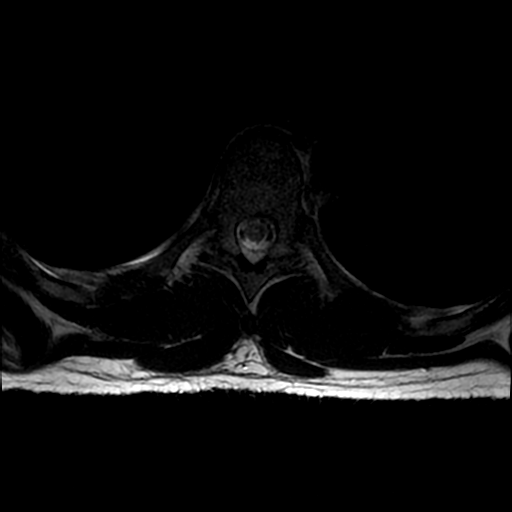
[im 7/34]
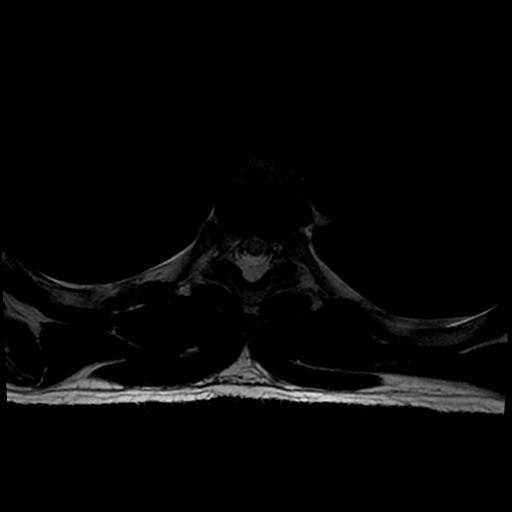
[im 14/34]
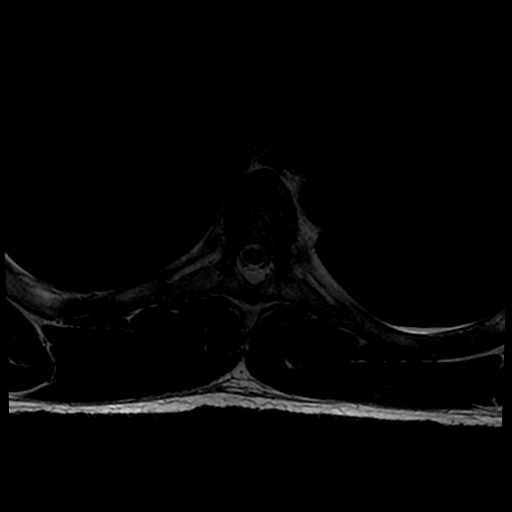
[im 20/34]
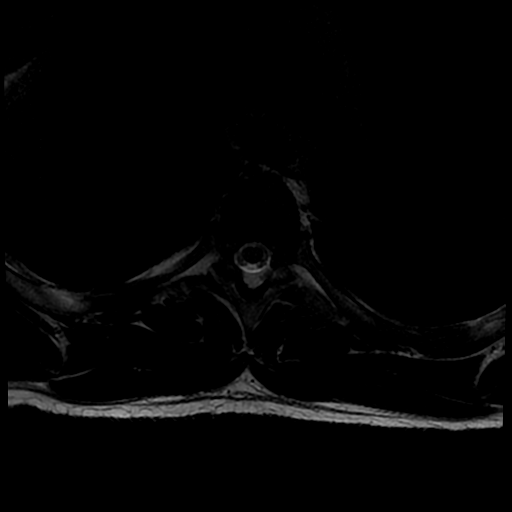
[im 27/34]
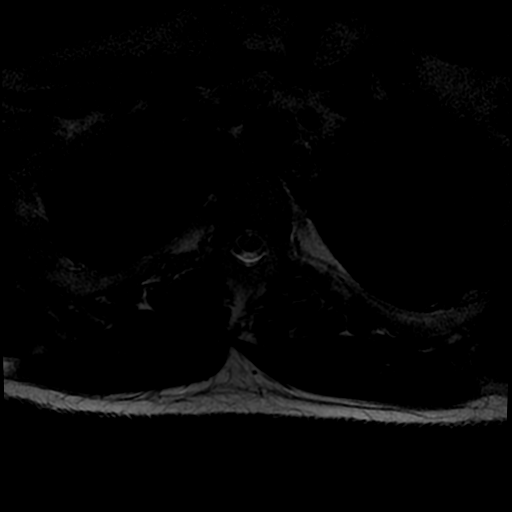
[im 34/34]
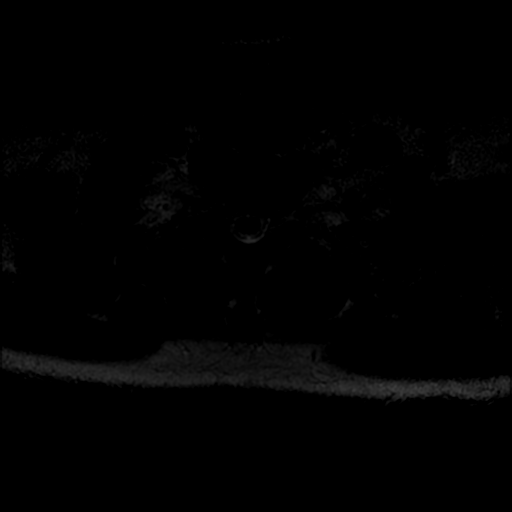

[Series 16: T2 · axial · 4.0mm · 0.39mm/px · z∈[-301,-263]mm · 2 of 22 slices shown (5 of 5)]
[im 1/22]
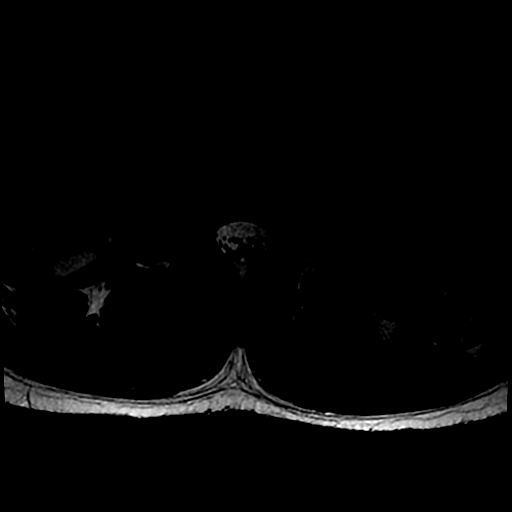
[im 8/22]
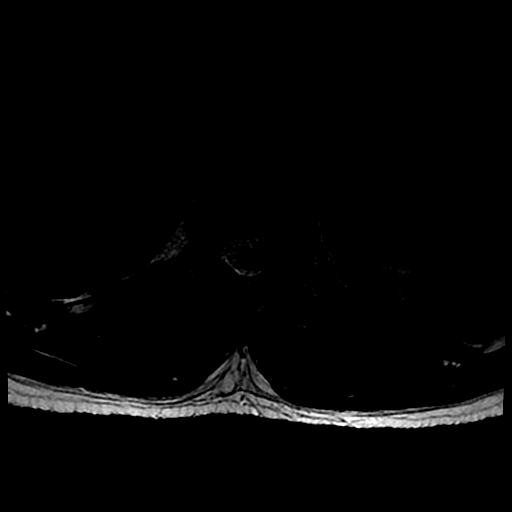

[18 of 48 positions shown; findings below may reference images not displayed]

FINDINGS: MRI THORACIC SPINE FINDINGS

Alignment: Vertebral bodies normally aligned with preservation of
the normal thoracic kyphosis. No listhesis.

Vertebrae: Vertebral body heights are well maintained. No evidence
for acute or chronic fracture. Signal intensity within the vertebral
body bone marrow is normal. No discrete or worrisome osseous
lesions. No abnormal marrow edema.

Cord: Signal intensity within the thoracic spinal cord is normal.
Cord caliber is normal.

Paraspinal and other soft tissues: Paraspinous soft tissues within
normal limits. Visualized lungs are clear. Visualized visceral
structures unremarkable.

Disc levels:

No significant degenerative changes seen within the thoracic spine.
No disc bulge or disc protrusion. No significant canal or foraminal
stenosis.

MRI LUMBAR SPINE FINDINGS

Segmentation: Normal segmentation. Lowest well-formed disc is
labeled the L5-S1 level.

Alignment: Vertebral bodies normally aligned with preservation of
the normal lumbar lordosis. No listhesis.

Vertebrae: Vertebral body heights are well maintained. No evidence
for acute or chronic fracture. Signal intensity within the vertebral
body bone marrow is normal. No discrete or worrisome osseous
lesions. No abnormal marrow edema.

Conus medullaris: Extends to the L1 level and appears normal.

Paraspinal and other soft tissues: Paraspinous soft tissues within
normal limits. Visualized visceral structures unremarkable.

Disc levels:

No significant degenerative changes within the lumbar spine.
Intervertebral discs well hydrated. No disc bulge or disc
protrusion. No canal or foraminal stenosis. No evidence for neural
impingement. No significant facet disease.
IMPRESSION: MR THORACIC SPINE IMPRESSION

Normal MRI of the thoracic spine.

MR LUMBAR SPINE IMPRESSION

Normal MRI of the lumbar spine.

## 2018-10-06 IMAGING — CT CT CERVICAL SPINE W/O CM
3 of 4 series · 13 of 33 positions shown, 16 images · non-contrast
Comparison: None.

CLINICAL DATA: Neck pain for 1 week.

EXAM:
CT CERVICAL SPINE WITHOUT CONTRAST
TECHNIQUE: Multidetector CT imaging of the cervical spine was performed without
intravenous contrast. Multiplanar CT image reconstructions were also
generated.

[Series 4: c_spine 2.0 st · axial · 0.27mm/px · z∈[-161,-33]mm · 5 of 97 slices shown, 7 images]
[im 17/97  soft-tissue]
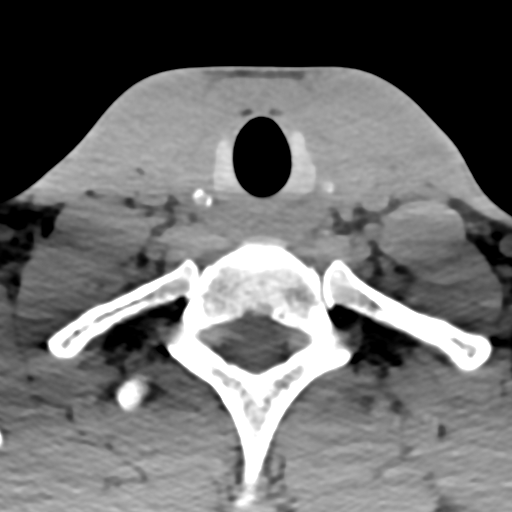
[im 17/97  bone]
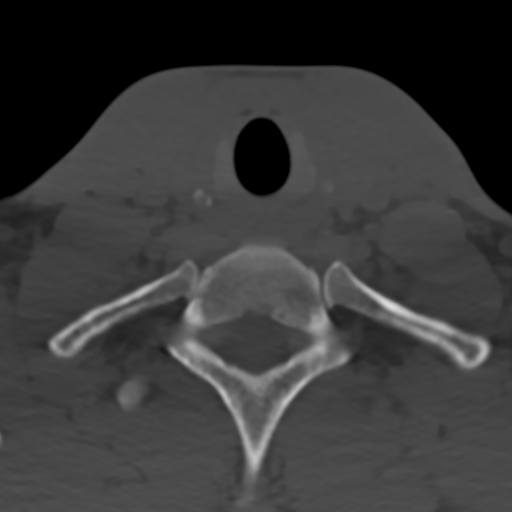
[im 33/97  bone]
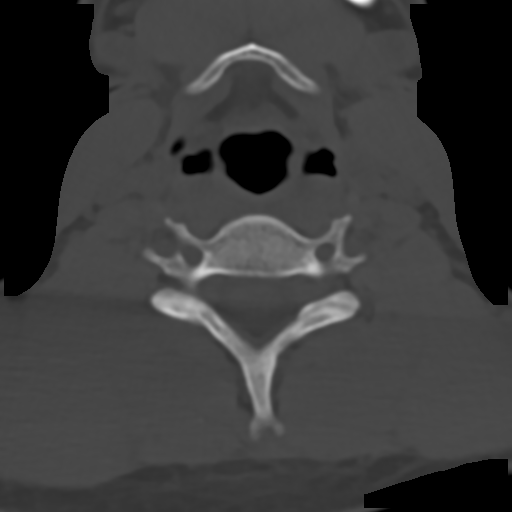
[im 49/97  bone]
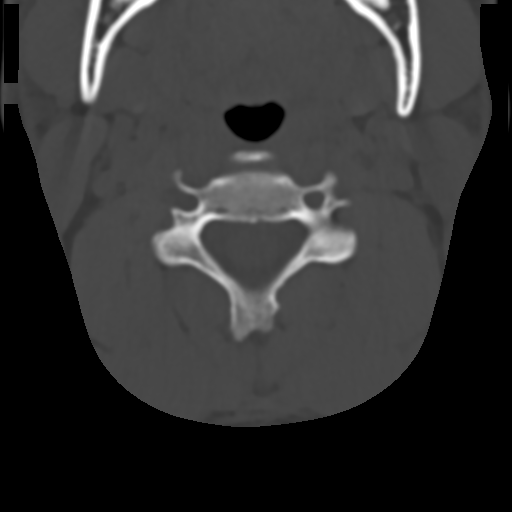
[im 65/97  bone]
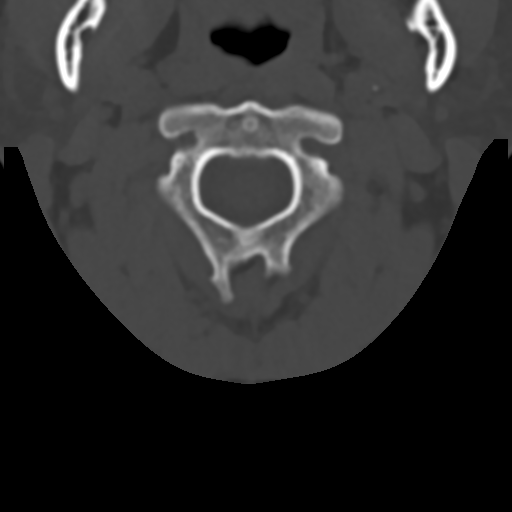
[im 81/97  soft-tissue]
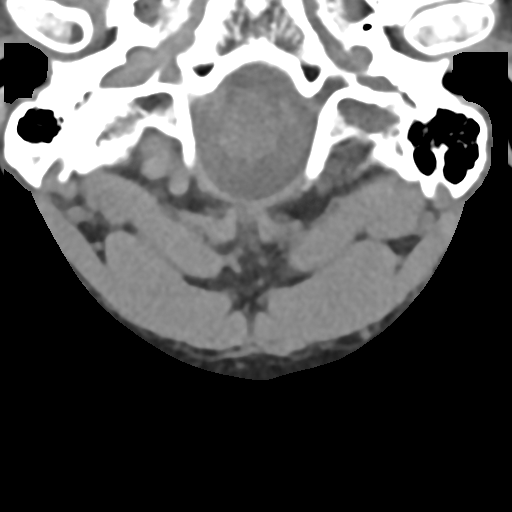
[im 81/97  bone]
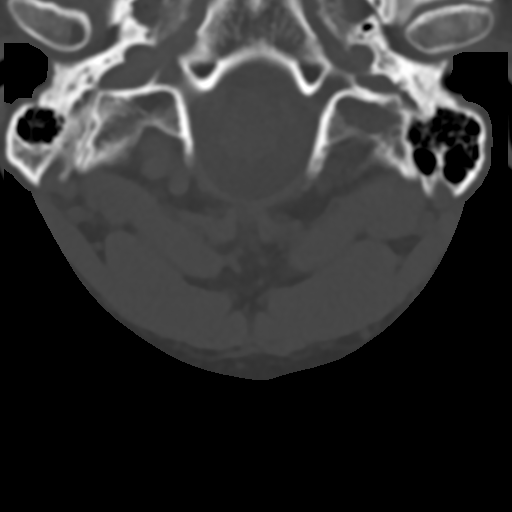

[Series 6: c_spine 2.0 sag bone · sagittal · 0.25mm/px · 5 of 61 slices shown, 6 images]
[im 21/61  bone]
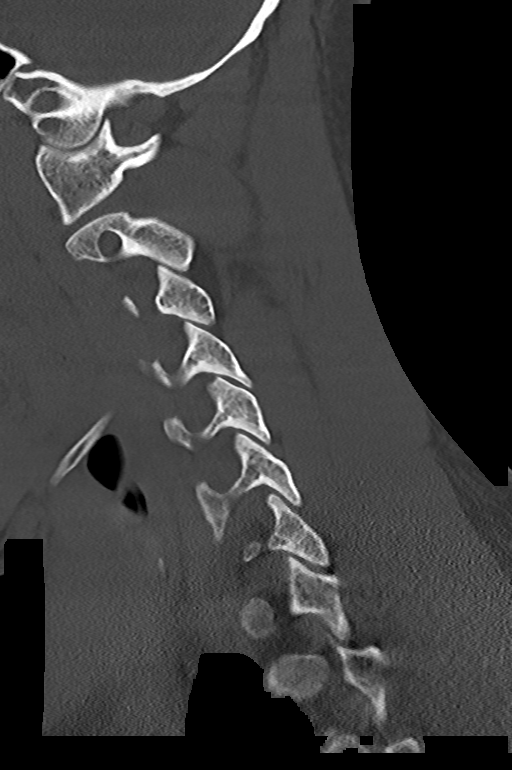
[im 26/61  bone]
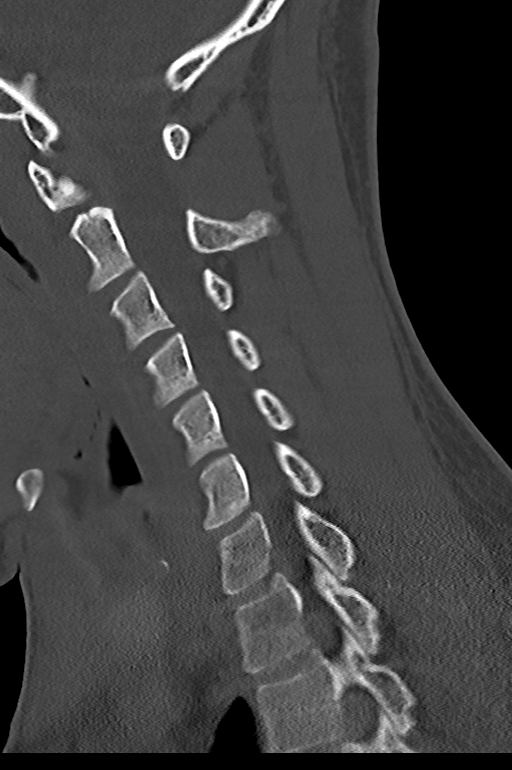
[im 31/61  soft-tissue]
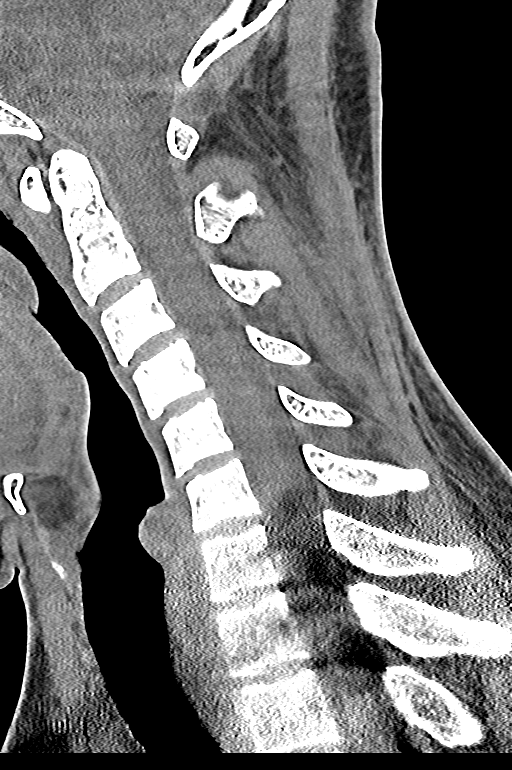
[im 31/61  bone]
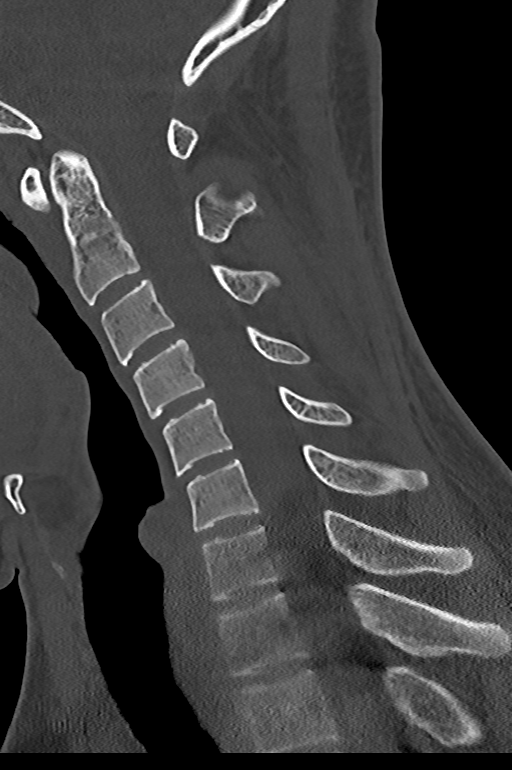
[im 36/61  bone]
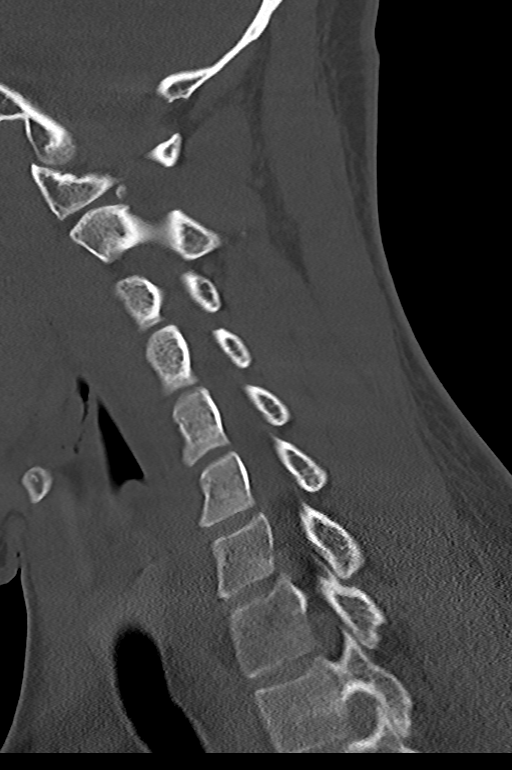
[im 41/61  bone]
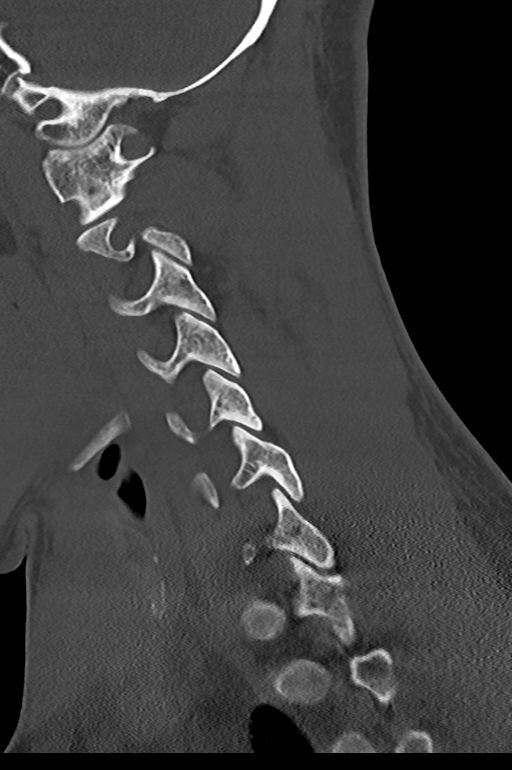

[Series 7: c_spine 2.0 cor bone · coronal · 0.29mm/px · 3 of 75 slices shown]
[im 15/75  bone]
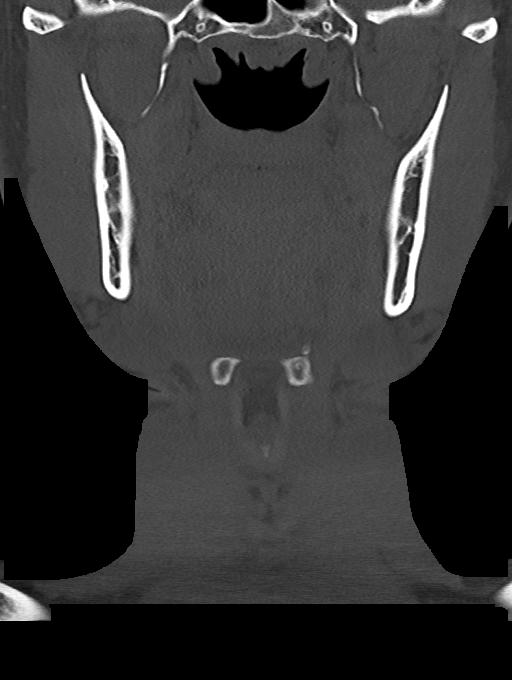
[im 30/75  bone]
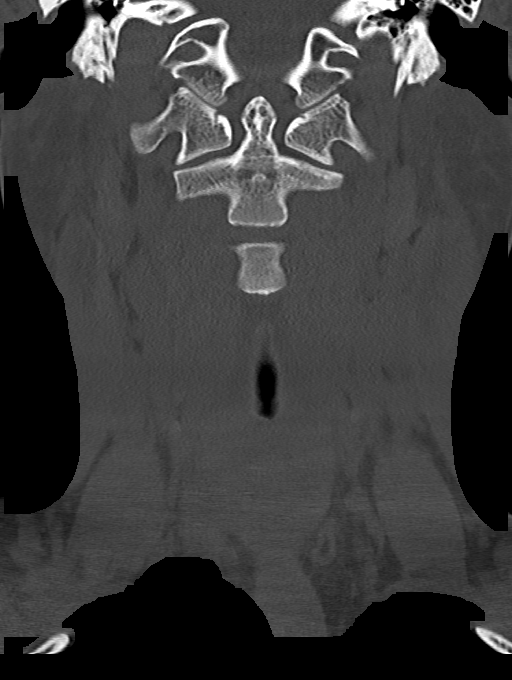
[im 45/75  bone]
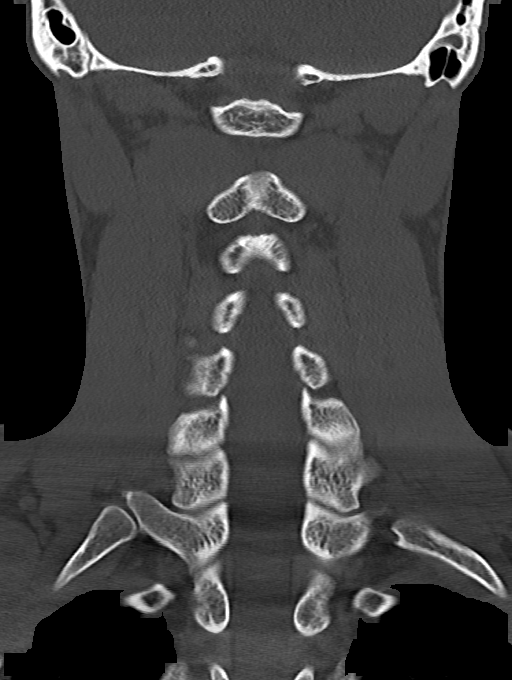

[13 of 33 positions shown; findings below may reference images not displayed]

FINDINGS: Alignment: Normal.

Skull base and vertebrae: No acute fracture. No primary bone lesion
or focal pathologic process.

Soft tissues and spinal canal: No prevertebral fluid or swelling. No
visible canal hematoma.

Disc levels:  Normal.

Upper chest: Negative.

Other: None.
IMPRESSION: Normal cervical spine.

## 2022-04-17 ENCOUNTER — Ambulatory Visit (HOSPITAL_COMMUNITY)
Admission: EM | Admit: 2022-04-17 | Discharge: 2022-04-17 | Disposition: A | Discharge status: Home / Self Care | Payer: 59 | Attending: Family Medicine | Admitting: Family Medicine

## 2022-04-17 ENCOUNTER — Encounter (HOSPITAL_COMMUNITY): Payer: Self-pay

## 2022-04-17 DIAGNOSIS — Z113 Encounter for screening for infections with a predominantly sexual mode of transmission: Secondary | ICD-10-CM | POA: Insufficient documentation

## 2022-04-17 DIAGNOSIS — Z202 Contact with and (suspected) exposure to infections with a predominantly sexual mode of transmission: Secondary | ICD-10-CM

## 2022-04-17 LAB — HIV ANTIBODY (ROUTINE TESTING W REFLEX): HIV Screen 4th Generation wRfx: NONREACTIVE

## 2022-04-17 NOTE — Discharge Instructions (Signed)
Staff will notify you if anything is positive on the swab or the other blood test.

## 2022-04-17 NOTE — ED Provider Notes (Signed)
He had [facility]    CSN: [id number] Arrival date & time: 04/17/22  1806      History   Chief Complaint No chief complaint on file.   HPI Anthony Peck is a 36 y.o. male.   HPI Here for screening for sexually transmitted infections.  No penile discharge or dysuria.  No abdominal pain, fever, or vomiting.  He does wish also to have blood work for screening for HIV and RPR  Past Medical History:  Diagnosis Date   Back pain     Patient Active Problem List   Diagnosis Date Noted   Musculoskeletal back pain 04/07/2017    Past Surgical History:  Procedure Laterality Date   no previous surgeries         Home Medications    Prior to Admission medications   Not on File    Family History Family History  Problem Relation Age of Onset   Lumbar disc disease Mother     Social History Social History   Tobacco Use   Smoking status: Every Day    Packs/day: 0.50    Types: Cigarettes   Smokeless tobacco: Never  Substance Use Topics   Alcohol use: Yes    Comment: weekly   Drug use: No     Allergies   Patient has no known allergies.   Review of Systems Review of Systems   Physical Exam Triage Vital Signs ED Triage Vitals [04/17/22 1820]  Enc Vitals Group     BP (!) 155/87     Pulse Rate 90     Resp 16     Temp 98.5 F (36.9 C)     Temp Source Oral     SpO2 99 %     Weight      Height      Head Circumference      Peak Flow      Pain Score      Pain Loc      Pain Edu?      Excl. in [place]?    No data found.  Updated Vital Signs BP (!) 155/87 (BP Location: Left Arm)   Pulse 90   Temp 98.5 F (36.9 C) (Oral)   Resp 16   SpO2 99%   Visual Acuity Right Eye Distance:   Left Eye Distance:   Bilateral Distance:    Right Eye Near:   Left Eye Near:    Bilateral Near:     Physical Exam Vitals reviewed.  Constitutional:      General: He is not in acute distress.    Appearance: He is not ill-appearing, toxic-appearing or  diaphoretic.  HENT:     Mouth/Throat:     Mouth: Mucous membranes are moist.  Cardiovascular:     Rate and Rhythm: Normal rate and regular rhythm.     Heart sounds: No murmur heard. Pulmonary:     Effort: Pulmonary effort is normal.     Breath sounds: Normal breath sounds.  Skin:    Coloration: Skin is not jaundiced or pale.  Neurological:     Mental Status: He is alert and oriented to person, place, and time.  Psychiatric:        Behavior: Behavior normal.      UC Treatments / Results  Labs (all labs ordered are listed, but only abnormal results are displayed) Labs Reviewed  RPR  HIV ANTIBODY (ROUTINE TESTING W REFLEX)  CYTOLOGY, (ORAL, ANAL, URETHRAL) ANCILLARY ONLY    EKG  Radiology No results found.  Procedures Procedures (including critical care time)  Medications Ordered in UC Medications - No data to display  Initial Impression / Assessment and Plan / UC Course  I have reviewed the triage vital signs and the nursing notes.  Pertinent labs & imaging results that were available during my care of the patient were reviewed by me and considered in my medical decision making (see chart for details).     Staff will notify patient of any positives on his swab and treat per protocol.  They will also notify him of any positives on the blood test. Final Clinical Impressions(s) / UC Diagnoses   Final diagnoses:  Screening for STD (sexually transmitted disease)     Discharge Instructions      Staff will notify you if anything is positive on the swab or the other blood test.    ED Prescriptions   None    PDMP not reviewed this encounter.   Zenia Resides, MD 04/17/22 [id number]

## 2022-04-17 NOTE — ED Triage Notes (Signed)
Pt is here for STD testing. He reports no current symptoms at this time.

## 2022-04-18 LAB — CYTOLOGY, (ORAL, ANAL, URETHRAL) ANCILLARY ONLY
Chlamydia: NEGATIVE
Comment: NEGATIVE
Comment: NEGATIVE
Comment: NORMAL
Neisseria Gonorrhea: NEGATIVE
Trichomonas: NEGATIVE

## 2022-04-18 LAB — RPR: RPR Ser Ql: NONREACTIVE

## 2022-11-05 ENCOUNTER — Ambulatory Visit (HOSPITAL_COMMUNITY)
Admission: RE | Admit: 2022-11-05 | Discharge: 2022-11-05 | Disposition: A | Discharge status: Home / Self Care | Payer: 59 | Source: Ambulatory Visit | Attending: Emergency Medicine | Admitting: Emergency Medicine

## 2022-11-05 ENCOUNTER — Encounter (HOSPITAL_COMMUNITY): Payer: Self-pay

## 2022-11-05 VITALS — BP 139/88 | HR 90 | Temp 98.5°F | Resp 14

## 2022-11-05 DIAGNOSIS — Z202 Contact with and (suspected) exposure to infections with a predominantly sexual mode of transmission: Secondary | ICD-10-CM

## 2022-11-05 DIAGNOSIS — Z113 Encounter for screening for infections with a predominantly sexual mode of transmission: Secondary | ICD-10-CM | POA: Diagnosis present

## 2022-11-05 NOTE — ED Triage Notes (Signed)
Pt requesting STD testing denies exposure or s/s.

## 2022-11-05 NOTE — ED Provider Notes (Signed)
[facility]    CSN: [id number] Arrival date & time: 11/05/22  [place]      History   Chief Complaint Chief Complaint  Patient presents with   Exposure to STD    Just want to have a check up - Entered by patient    HPI Anthony Peck is a 37 y.o. male.  Presents for STD testing Denies any known exposures Not having any symptoms including penile discharge, dysuria, testicular pain or swelling, rash Had blood work done about 6 months ago  Past Medical History:  Diagnosis Date   Back pain     Patient Active Problem List   Diagnosis Date Noted   Musculoskeletal back pain 04/07/2017    Past Surgical History:  Procedure Laterality Date   no previous surgeries         Home Medications    Prior to Admission medications   Not on File    Family History Family History  Problem Relation Age of Onset   Lumbar disc disease Mother     Social History Social History   Tobacco Use   Smoking status: Every Day    Packs/day: 0.50    Types: Cigarettes   Smokeless tobacco: Never  Substance Use Topics   Alcohol use: Yes    Comment: weekly   Drug use: No     Allergies   Patient has no known allergies.   Review of Systems Review of Systems As per HPI  Physical Exam Triage Vital Signs ED Triage Vitals  Enc Vitals Group     BP 11/05/22 1905 139/88     Pulse Rate 11/05/22 1905 90     Resp 11/05/22 1905 14     Temp 11/05/22 1905 98.5 F (36.9 C)     Temp Source 11/05/22 1905 Oral     SpO2 11/05/22 1905 96 %     Weight --      Height --      Head Circumference --      Peak Flow --      Pain Score 11/05/22 1904 0     Pain Loc --      Pain Edu? --      Excl. in [place]? --    No data found.  Updated Vital Signs BP 139/88 (BP Location: Right Arm)   Pulse 90   Temp 98.5 F (36.9 C) (Oral)   Resp 14   SpO2 96%   Physical Exam Vitals and nursing note reviewed.  Constitutional:      General: He is not in acute distress.    Appearance: Normal  appearance.  HENT:     Mouth/Throat:     Pharynx: Oropharynx is clear.  Cardiovascular:     Rate and Rhythm: Normal rate and regular rhythm.     Pulses: Normal pulses.  Pulmonary:     Effort: Pulmonary effort is normal.  Neurological:     Mental Status: He is alert and oriented to person, place, and time.      UC Treatments / Results  Labs (all labs ordered are listed, but only abnormal results are displayed) Labs Reviewed  CYTOLOGY, (ORAL, ANAL, URETHRAL) ANCILLARY ONLY    EKG   Radiology No results found.  Procedures Procedures (including critical care time)  Medications Ordered in UC Medications - No data to display  Initial Impression / Assessment and Plan / UC Course  I have reviewed the triage vital signs and the nursing notes.  Pertinent labs & imaging  results that were available during my care of the patient were reviewed by me and considered in my medical decision making (see chart for details).  Cyto swab pending. Treat positive if indicated No questions at this time  Final Clinical Impressions(s) / UC Diagnoses   Final diagnoses:  Screen for STD (sexually transmitted disease)     Discharge Instructions      We will call you if anything on your swab returns positive. Please abstain from sexual intercourse until your results return.     ED Prescriptions   None    PDMP not reviewed this encounter.   Kyra Leyland 11/05/22 1911

## 2022-11-05 NOTE — Discharge Instructions (Signed)
We will call you if anything on your swab returns positive. Please abstain from sexual intercourse until your results return.

## 2022-11-06 LAB — CYTOLOGY, (ORAL, ANAL, URETHRAL) ANCILLARY ONLY
Chlamydia: NEGATIVE
Comment: NEGATIVE
Comment: NEGATIVE
Comment: NORMAL
Neisseria Gonorrhea: NEGATIVE
Trichomonas: NEGATIVE
